# Patient Record
Sex: Female | Born: 2000 | Race: White | Hispanic: No | Marital: Single | Smoking: Never smoker
Health system: Southern US, Community
[De-identification: ages and names within clinical notes are randomized; demographics above are authoritative.]

---
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
## 2018-02-02 ENCOUNTER — Telehealth: Payer: Self-pay | Admitting: Pediatrics

## 2018-02-02 NOTE — Telephone Encounter (Signed)
Father called stating patient was having trouble in college focusing. Father would like her to be seen by someone who specializes in ADHD. Patient is currently in college at [employer]. Gave phone number to father for [facility] specialist for him to call to schedule an appt.
